# Patient Record
Sex: Male | Born: 1960 | Race: White | Hispanic: No | Smoking: Never smoker
Health system: Southern US, Community
[De-identification: ages and names within clinical notes are randomized; demographics above are authoritative.]

---

## 1997-11-26 ENCOUNTER — Ambulatory Visit (HOSPITAL_COMMUNITY): Admission: RE | Admit: 1997-11-26 | Discharge: 1997-11-26 | Payer: Self-pay | Admitting: *Deleted

## 1998-01-26 ENCOUNTER — Encounter: Admission: RE | Admit: 1998-01-26 | Discharge: 1998-04-26 | Payer: Self-pay | Admitting: Anesthesiology

## 1998-07-21 ENCOUNTER — Encounter: Admission: RE | Admit: 1998-07-21 | Discharge: 1998-07-21 | Payer: Self-pay | Admitting: Pediatrics

## 2003-05-07 ENCOUNTER — Encounter: Admission: RE | Admit: 2003-05-07 | Discharge: 2003-05-07 | Payer: Self-pay | Admitting: Family Medicine

## 2003-06-17 ENCOUNTER — Emergency Department (HOSPITAL_COMMUNITY): Admission: EM | Admit: 2003-06-17 | Discharge: 2003-06-18 | Payer: Self-pay | Admitting: Emergency Medicine

## 2004-05-29 IMAGING — CR DG CHEST 1V PORT
1 series · 1 of 1 positions shown · non-contrast
Comparison: none

CLINICAL DATA: 43 year-old male with chest pain
 PORTABLE CHEST   (6669 hours)
 No comparison.

[view not recorded]
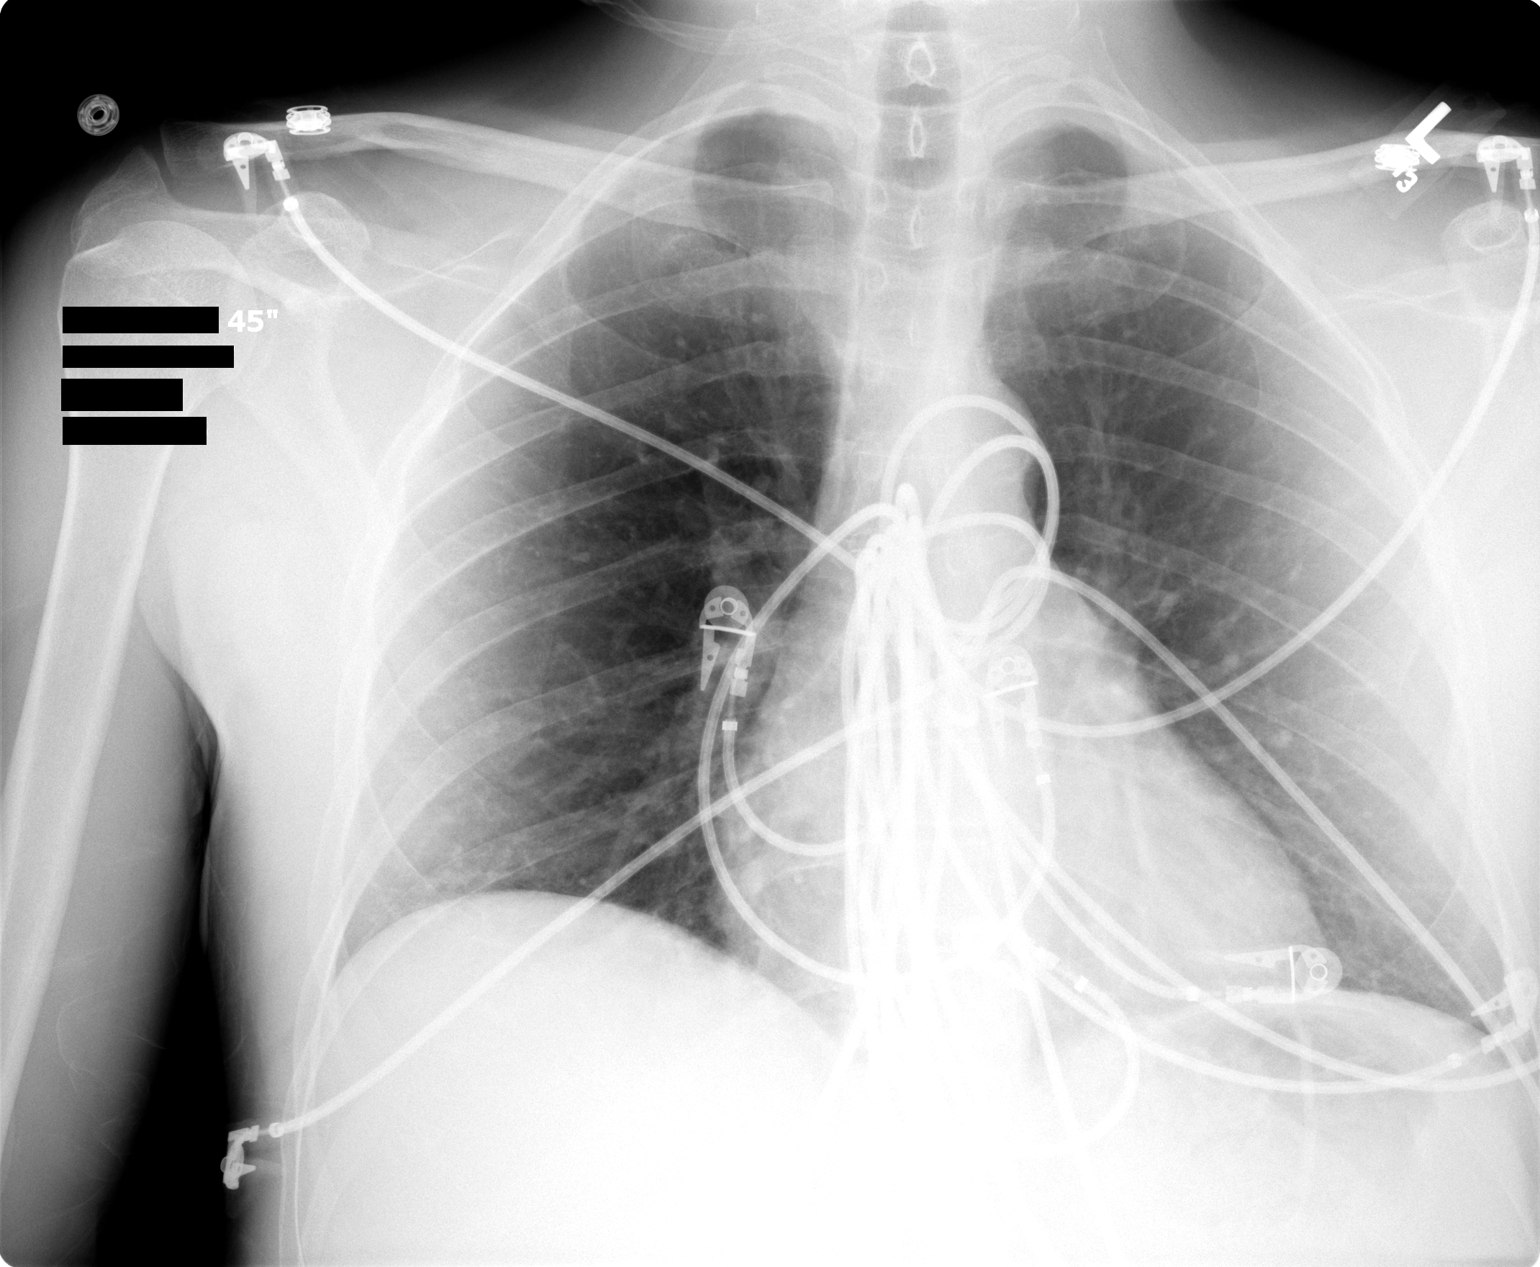

[1 of 1 positions shown; findings below may reference images not displayed]

FINDINGS: Telemetry leads overlie the chest.  Heart size is normal for technique.  No acute air space disease, edema, effusion, or pneumothorax.
 IMPRESSION
 No active chest disease.

## 2008-10-29 ENCOUNTER — Emergency Department (HOSPITAL_COMMUNITY): Admission: EM | Admit: 2008-10-29 | Discharge: 2008-10-29 | Payer: Self-pay | Admitting: Emergency Medicine

## 2008-12-28 ENCOUNTER — Encounter: Admission: RE | Admit: 2008-12-28 | Discharge: 2008-12-28 | Payer: Self-pay | Admitting: Family Medicine

## 2009-12-10 IMAGING — CT CT ABDOMEN W/ CM
3 of 5 series · 13 of 36 positions shown, 19 images · IV contrast (READICAT/WATER & [ID] OMNI [id number])
Comparison: None

CT ABDOMEN

CLINICAL DATA: Diffuse abdominal pain.  Cramping.  No history of
surgery or trauma.

CT ABDOMEN AND PELVIS WITH CONTRAST
TECHNIQUE: Multidetector CT imaging of the abdomen and pelvis was
performed using the standard protocol following bolus
administration of intravenous contrast.
Contrast: 125 ml of Vmnipaque-LLL

[Series 3: routine abdomen · axial · 0.74mm/px · z∈[-416,-91]mm · 7 of 87 slices shown, 12 images]
[im 11/87  soft-tissue]
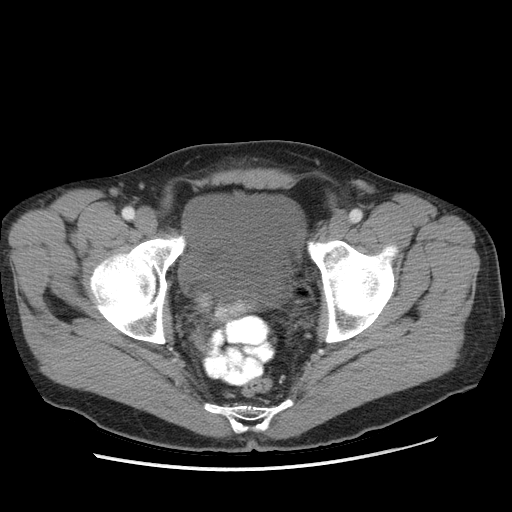
[im 11/87  bone]
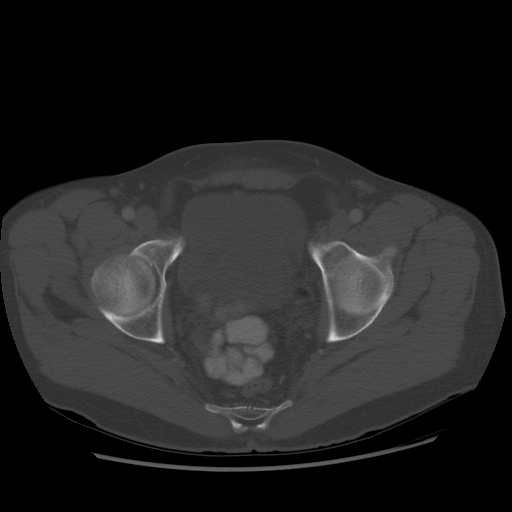
[im 22/87  soft-tissue]
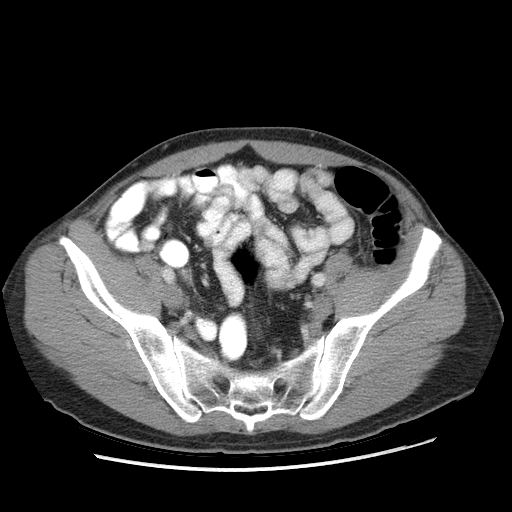
[im 33/87  soft-tissue]
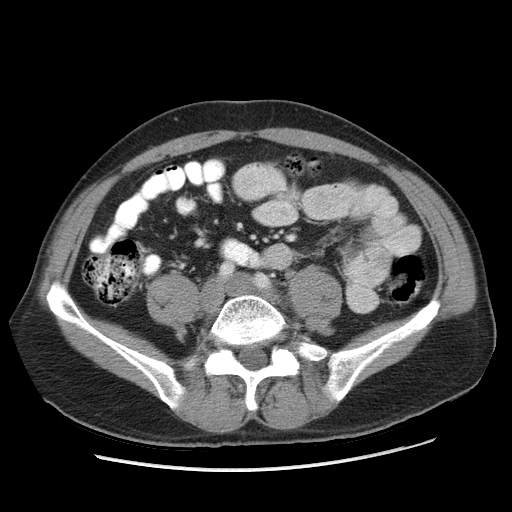
[im 44/87  soft-tissue]
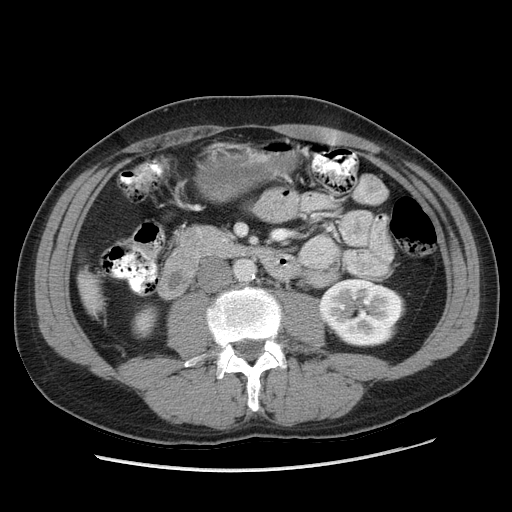
[im 44/87  lung]
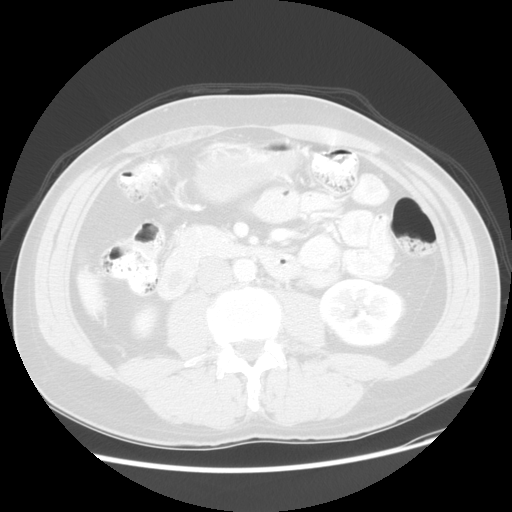
[im 54/87  soft-tissue]
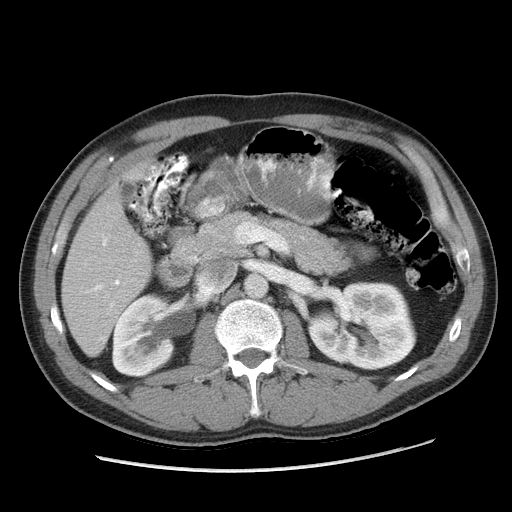
[im 54/87  lung]
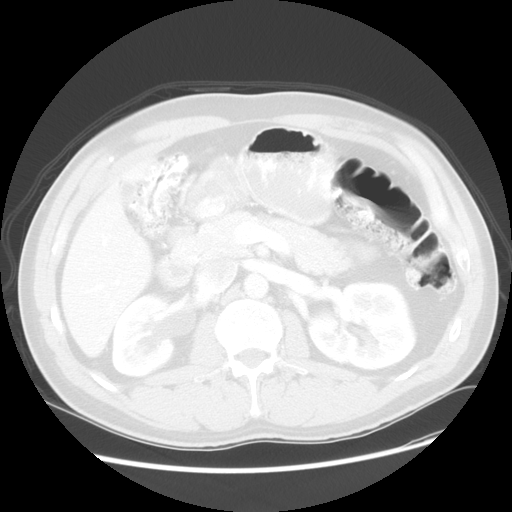
[im 65/87  soft-tissue]
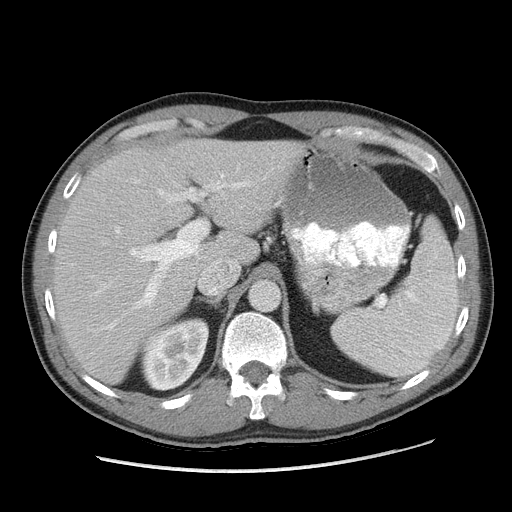
[im 65/87  lung]
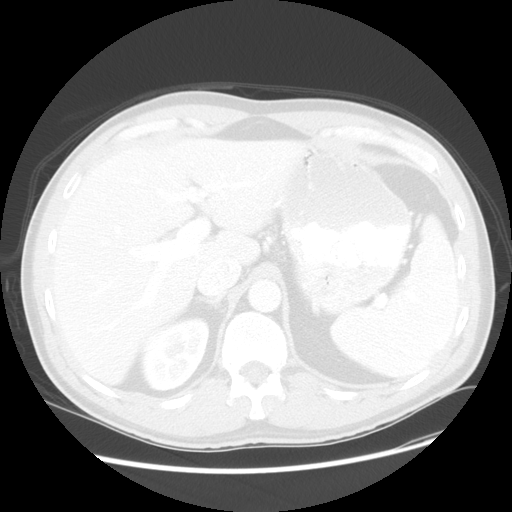
[im 76/87  soft-tissue]
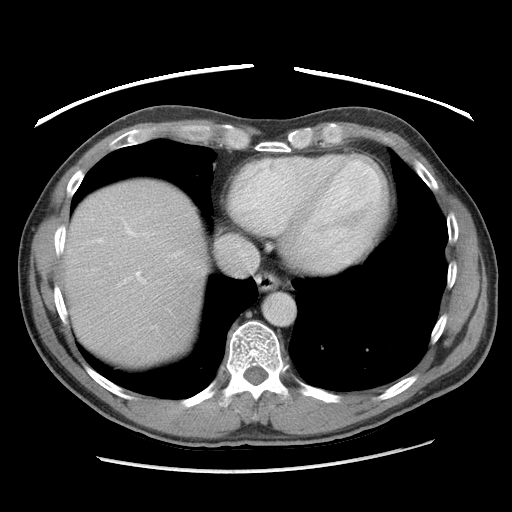
[im 76/87  lung]
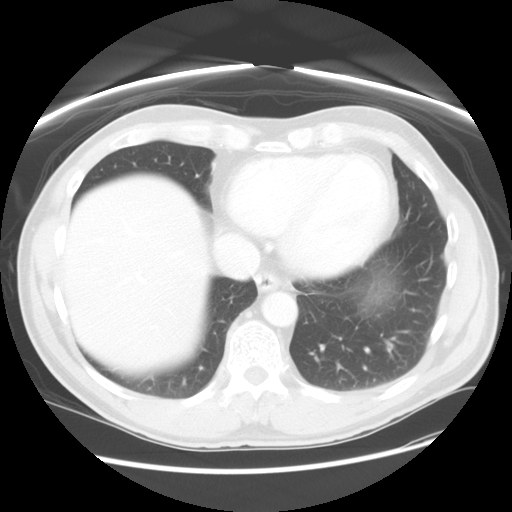

[Series 601: coronal body · coronal · 0.90mm/px · 1 of 111 slices shown, 2 images]
[im 37/111  soft-tissue]
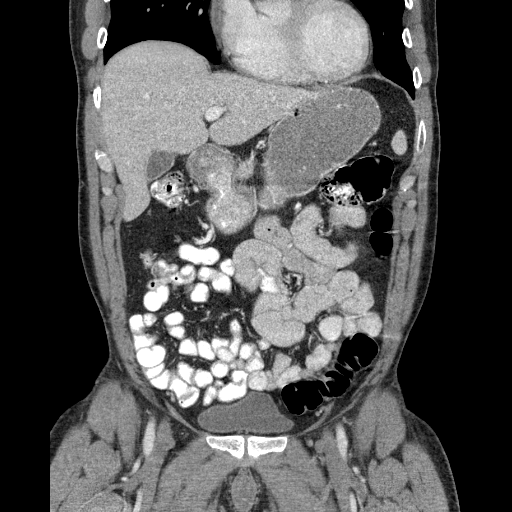
[im 37/111  bone]
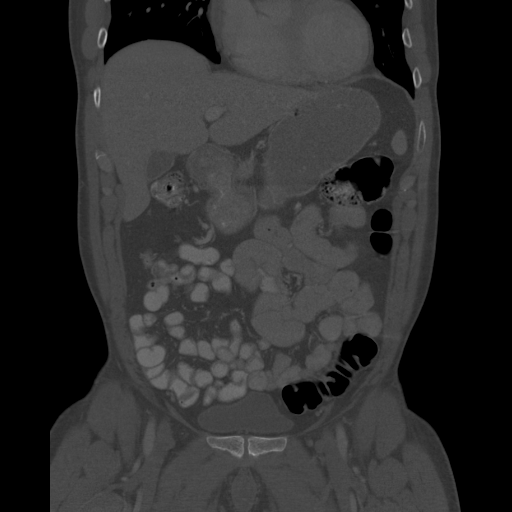

[Series 602: sagittal body · sagittal · 0.90mm/px · 5 of 153 slices shown]
[im 11/153  soft-tissue]
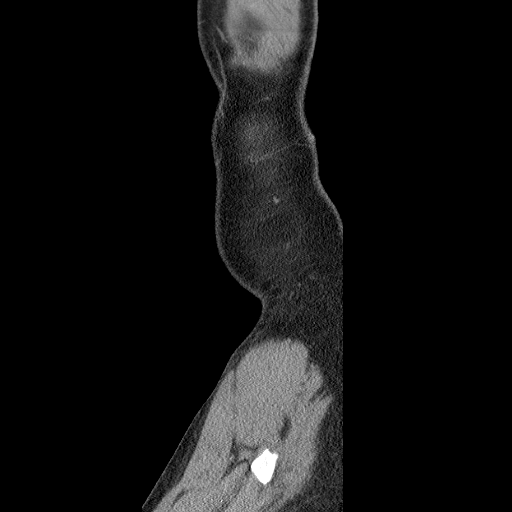
[im 31/153  soft-tissue]
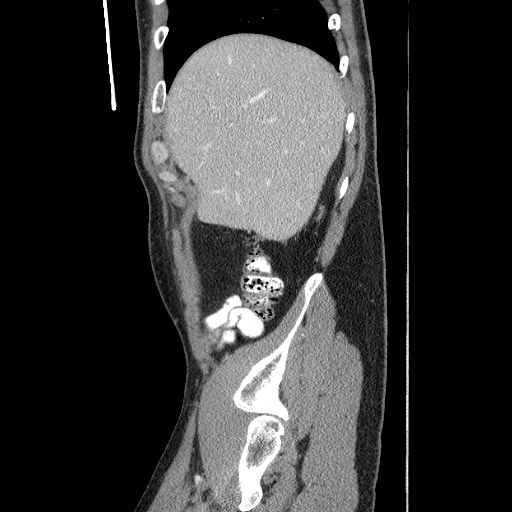
[im 51/153  soft-tissue]
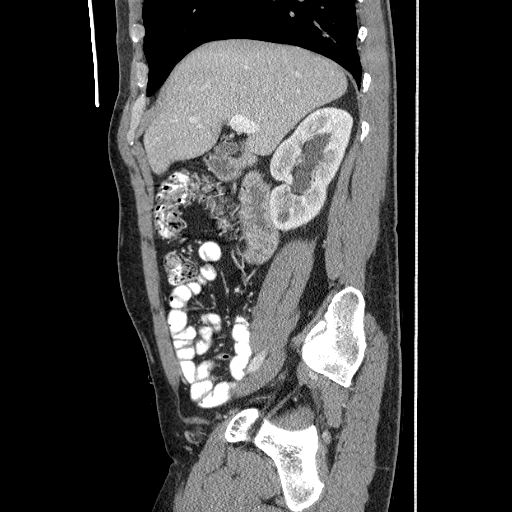
[im 71/153  soft-tissue]
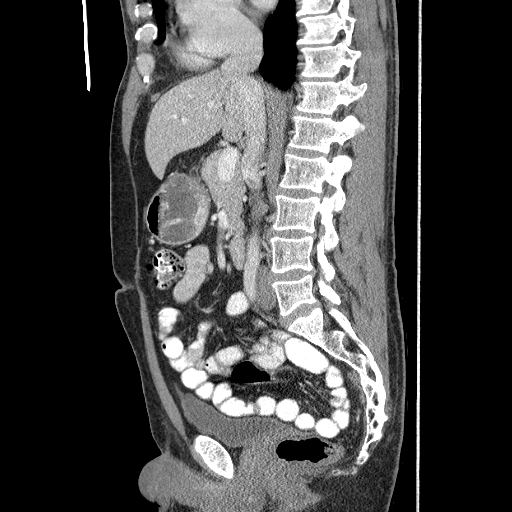
[im 82/153  soft-tissue]
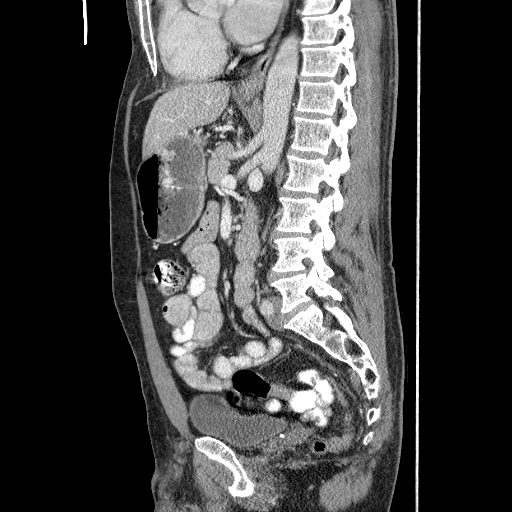

[13 of 36 positions shown; findings below may reference images not displayed]

FINDINGS: The lung bases are clear except for minimal dependent
atelectasis.  The distal esophagus is unremarkable.

The liver demonstrates no significant abnormalities.  The
gallbladder appears normal.  No biliary dilatation.  The pancreas
is unremarkable.  The spleen is normal in size.  The adrenal glands
are normal.  Left kidney is normal.  The right kidney demonstrates
moderate hydronephrosis and this is due to a 3 x 3 mm right mid
ureteral calculus located at the L4 level.

The stomach, duodenum, small bowel and colon are unremarkable.  No
mesenteric or retroperitoneal masses or adenopathy.  The aorta is
normal in caliber.  Mild atherosclerotic changes.

The bony structures are unremarkable.
IMPRESSION: 1.  3 x 3 mm right mid ureteral calculus causing moderate right-
sided hydroureteronephrosis.
2.  No other significant abdominal findings.

CT PELVIS
FINDINGS: The rectum, sigmoid colon visualized small bowel loops
are unremarkable.  The appendix is normal.  The bladder is normal.
No distal ureteral or bladder calculi.  No pelvic mass or
adenopathy.  No inguinal mass or hernia.  The bony pelvis is
intact.
IMPRESSION: Unremarkable CT pelvis.

## 2010-06-24 LAB — CBC
MCHC: 34.1 g/dL (ref 30.0–36.0)
MCV: 88 fL (ref 78.0–100.0)
Platelets: 216 10*3/uL (ref 150–400)
RDW: 13.7 % (ref 11.5–15.5)
WBC: 12.3 10*3/uL — ABNORMAL HIGH (ref 4.0–10.5)

## 2010-06-24 LAB — POCT CARDIAC MARKERS

## 2010-06-24 LAB — DIFFERENTIAL
Basophils Absolute: 0 10*3/uL (ref 0.0–0.1)
Basophils Relative: 0 % (ref 0–1)
Eosinophils Absolute: 0.1 10*3/uL (ref 0.0–0.7)
Lymphs Abs: 0.4 10*3/uL — ABNORMAL LOW (ref 0.7–4.0)
Neutrophils Relative %: 90 % — ABNORMAL HIGH (ref 43–77)

## 2010-06-24 LAB — BASIC METABOLIC PANEL
BUN: 13 mg/dL (ref 6–23)
Chloride: 106 mEq/L (ref 96–112)
Creatinine, Ser: 0.72 mg/dL (ref 0.4–1.5)
Glucose, Bld: 103 mg/dL — ABNORMAL HIGH (ref 70–99)

## 2010-06-24 LAB — D-DIMER, QUANTITATIVE: D-Dimer, Quant: <0.22 ug/mL-FEU (ref 0.00–0.48)

## 2018-05-16 ENCOUNTER — Other Ambulatory Visit: Payer: Self-pay | Admitting: Family Medicine

## 2018-05-16 DIAGNOSIS — Z8739 Personal history of other diseases of the musculoskeletal system and connective tissue: Secondary | ICD-10-CM

## 2018-05-16 DIAGNOSIS — Z8781 Personal history of (healed) traumatic fracture: Secondary | ICD-10-CM

## 2020-05-05 ENCOUNTER — Other Ambulatory Visit: Payer: Self-pay

## 2020-05-05 ENCOUNTER — Encounter: Payer: Self-pay | Admitting: Cardiology

## 2020-05-05 ENCOUNTER — Ambulatory Visit: Payer: BC Managed Care – PPO | Admitting: Cardiology

## 2020-05-05 VITALS — BP 124/70 | HR 58 | Ht 66.0 in | Wt 162.6 lb

## 2020-05-05 DIAGNOSIS — Z8249 Family history of ischemic heart disease and other diseases of the circulatory system: Secondary | ICD-10-CM

## 2020-05-05 NOTE — Patient Instructions (Signed)
Medication Instructions:  No changes  *If you need a refill on your cardiac medications before your next appointment, please call your pharmacy*   Lab Work: Not needed    Testing/Procedures:  CT coronary calcium score. This test is done at [address]. This is $99 out of pocket.   Coronary CalciumScan A coronary calcium scan is an imaging test used to look for deposits of calcium and other fatty materials (plaques) in the inner lining of the blood vessels of the heart (coronary arteries). These deposits of calcium and plaques can partly clog and narrow the coronary arteries without producing any symptoms or warning signs. This puts a person at risk for a heart attack. This test can detect these deposits before symptoms develop. Tell a health care provider about:  Any allergies you have.  All medicines you are taking, including vitamins, herbs, eye drops, creams, and over-the-counter medicines.  Any problems you or family members have had with anesthetic medicines.  Any blood disorders you have.  Any surgeries you have had.  Any medical conditions you have.  Whether you are pregnant or may be pregnant. What are the risks? Generally, this is a safe procedure. However, problems may occur, including:  Harm to a pregnant woman and her unborn baby. This test involves the use of radiation. Radiation exposure can be dangerous to a pregnant woman and her unborn baby. If you are pregnant, you generally should not have this procedure done.  Slight increase in the risk of cancer. This is because of the radiation involved in the test. What happens before the procedure? No preparation is needed for this procedure. What happens during the procedure?  You will undress and remove any jewelry around your neck or chest.  You will put on a hospital gown.  Sticky electrodes will be placed on your chest. The electrodes will be connected to an electrocardiogram (ECG)  machine to record a tracing of the electrical activity of your heart.  A CT scanner will take pictures of your heart. During this time, you will be asked to lie still and hold your breath for 2-3 seconds while a picture of your heart is being taken. The procedure may vary among health care providers and hospitals. What happens after the procedure?  You can get dressed.  You can return to your normal activities.  It is up to you to get the results of your test. Ask your health care provider, or the department that is doing the test, when your results will be ready. Summary  A coronary calcium scan is an imaging test used to look for deposits of calcium and other fatty materials (plaques) in the inner lining of the blood vessels of the heart (coronary arteries).  Generally, this is a safe procedure. Tell your health care provider if you are pregnant or may be pregnant.  No preparation is needed for this procedure.  A CT scanner will take pictures of your heart.  You can return to your normal activities after the scan is done. This information is not intended to replace advice given to you by your health care provider. Make sure you discuss any questions you have with your health care provider. Document Released: 09/01/2007 Document Revised: 01/23/2016 Document Reviewed: 01/23/2016 Elsevier Interactive Patient Education  2017 [employer].   Follow-Up: At [facility], you and your health needs are our priority.  As part of our continuing mission to provide you with exceptional heart care, we have created  designated Provider Care Teams.  These Care Teams include your primary Cardiologist (physician) and Advanced Practice Providers (APPs -  Physician Assistants and Nurse Practitioners) who all work together to provide you with the care you need, when you need it.     Your next appointment:   1 month(s)  The format for your next appointment:   In Person  Provider:   Bryan Lemma, MD   Other Instructions   DR Herbie Baltimore RECOMMENDS THAT YOU  PURCHASES  " Kardia" By [employer]  [employer]. FROM THE  GOOGLE[employer]  APP PLAY STORE.   THE APP IS FREE , BUT THE  EQUIPMENT HAS A COST. IT IS A WAY FOR YOU TO OBTAIN A RECORDING OF YOUR HEART RATE . IT WILL BE A SHORT RHYTHM  STRIP THAT YOU CAN SHOW TO MEDICAL STAFF.  ALSO , YOU MAY WANT RESEARCH [employer] - EMAY - EKG- MONITOR YOU ARE ABLE TO KEEP RECORDINGS ON YOUR SMARTPHONE , INSTEAD OF PURCHASING A SEPARATE EQUIPMENT   Dr Herbie Baltimore  recommends portable  ECG monitor  to capture heart rate reading. There is a monitor that can be purchased on [employer]. The  monitor name is "EMAY"  at last checked it cost $79.  Our understanding there no more cost of that purchasing the machine. You will need a computer to be able to upload your recording and to be printed. It also come with a  USB to charge the monitor.

## 2020-05-05 NOTE — Progress Notes (Signed)
Primary Care Provider: Daisy Floro, MD Cardiologist: No primary care provider on file. Electrophysiologist: None  Clinic Note: Chief Complaint  Patient presents with  . New Patient (Initial Visit)    Observation cardiac evaluation given his family history of CAD.  He himself is asymptomatic.   ===================================  ASSESSMENT/PLAN   Problem List Items Addressed This Visit    Family history of atrial fibrillation - Primary (Chronic)    He has not had any palpitations or irregular heartbeats besides a few skipped beats here today.  Unlikely he would have A. fib, and not know it.  I explained the difference in atrial fibrillation, flutter and SVT.  We talked about pathophysiology and treatment  Recommended that he purchase either the  Kardia" By [employer]. (Google or [employer]) or [employer] monitor purchased through [employer]      Relevant Orders   CT CARDIAC SCORING (SELF PAY ONLY)   EKG 12-Lead   Family history of premature coronary artery disease (Chronic)    He was less concerned about his sisters cardiac score along with A. fib diagnosis is the fact that his father had both as well.   He wanted to get a baseline assessment. I explained the pathophysiology of atherosclerotic coronary artery disease.  Plan: Coronary calcium score      Relevant Orders   CT CARDIAC SCORING (SELF PAY ONLY)   EKG 12-Lead      ===================================  HPI:    Chris Wilson is a 60 y.o. male with a PMH mild depression and recent COVID-19 infection in January 2022 (treated with over-the-counter remedies) is being seen today for CARDIAC EVALUATION given EXTENSIVE FAMILY HISTORY OF CARDIAC DISEASE at the request of Daisy Floro, MD.  Becky Augusta was seen by Dr. Tenny Craw on February 7.  Apparently discussed that he needed COVID-19 but before that.  He did mention is concerned about his sister recently been diagnosed with A. fib and his father having had CAD,  CABG and A. fib.  He was concerned about his own risk.  Notes a very healthy diet with exercise.  Recent Hospitalizations: None  Reviewed  CV studies:    The following studies were reviewed today: (if available, images/films reviewed: From Epic Chart or Care Everywhere) . N/A  Interval History:   [name] is here today for cardiology evaluation.  He is totally asymptomatic cardiac standpoint.  He he noted having some intermittent bouts of sharp chest wall pain after long bouts of coughing when he had COVID.  In the edition exertional, therefore is not present now.  He otherwise has no exertional dyspnea.  No sense of arrhythmias.  Discussed anxiety.  Feels her heart skip every now and then.  He may get little bit dizzy if he gets up quickly.  CV Review of Symptoms (Summary): positive for - Chest wall pain, rare skipped beats, occasional dizziness negative for - dyspnea on exertion, edema, orthopnea, paroxysmal nocturnal dyspnea, rapid heart rate, shortness of breath or Lightheadedness dizziness, syncope/near syncope or TIA/amaurosis fugax, claudication Not as active as he once was, but still pretty healthy  The patient does not have symptoms concerning for COVID-19 infection (fever, chills, cough, or new shortness of breath).   REVIEWED OF SYSTEMS   Review of Systems  Constitutional: Negative for malaise/fatigue and weight loss.  HENT: Negative for congestion and sinus pain.   Respiratory: Negative for cough and shortness of breath.        No longer having  cough shortness of breath following his COVID-19  Cardiovascular: Negative for leg swelling.  Gastrointestinal: Negative for blood in stool and melena.  Genitourinary: Negative for frequency and hematuria.  Musculoskeletal: Positive for joint pain (His ankle still bothers him.).  Neurological: Negative for dizziness, focal weakness and headaches.  Psychiatric/Behavioral: Negative for memory loss. The patient is  nervous/anxious. The patient does not have insomnia.    I have reviewed and (if needed) personally updated the patient's problem list, medications, allergies, past medical and surgical history, social and family history.   PAST MEDICAL HISTORY   History reviewed. No pertinent past medical history.  PAST SURGICAL HISTORY   History reviewed. No pertinent surgical history.   There is no immunization history on file for this patient.  MEDICATIONS/ALLERGIES   Current Meds  Medication Sig  . albuterol (VENTOLIN HFA) 108 (90 Base) MCG/ACT inhaler 2 puff as needed  . ASPIRIN 81 PO Take 81 mg by mouth daily.  . clonazePAM (KLONOPIN) 2 MG tablet Take 2 mg by mouth at bedtime.  . cyanocobalamin 1000 MCG Marland Kitchentablet Take 1,000 mcg by mouth daily.  . desonide (DESOWEN) 0.05 % lotion Apply topically as needed.  Marland Kitchen escitalopram (LEXAPRO) 20 MG tablet Take 20 mg by mouth in the morning.  . mirtazapine (REMERON) 45 MG tablet Take 45 mg by mouth at bedtime.  Marland Kitchen VIIBRYD 40 MG TABS Take 40 mg by mouth in the morning.    Allergies  Allergen Reactions  . Seroquel [Quetiapine]     Other reaction(s): worsening symptoms    SOCIAL HISTORY/FAMILY HISTORY   Reviewed in Epic:  Pertinent findings:  Social History   Tobacco Use  . Smoking status: Never Smoker  . Smokeless tobacco: Never Used  Substance Use Topics  . Alcohol use: Not Currently  . Drug use: Not Currently    Types: Marijuana   Social History   Social History Narrative   Currently going through divorce.  Has 1 child.   Parents are negatives of [place].   He is former Actuary. Cabin crew      Does not drink alcohol caffeine or sodas.  No recreational drug use.  Non-smoker.   Is a very healthy diet of fruits and vegetables with no dairy.  He actually has not been eating as much since the onset of the divorce.   Drinks at least 1 gallon of water or mixed water drink daily.   Walks routinely enjoys doing yard work Catering manager.      Needs to be very  active exercising until 1244 when he fell on a ladder and injured his ankle.  Not able to run anymore.    OBJCTIVE -PE, EKG, labs   Wt Readings from Last 3 Encounters:  05/05/20 162 lb 9.6 oz (73.8 kg)    Physical Exam: BP 124/70 (BP Location: Left Arm, Patient Position: Sitting)   Pulse (!) 58   Ht  (1.676 m)   Wt 162 lb 9.6 oz (73.8 kg)   SpO2 96%   BMI 26.24 kg/m  Physical Exam Vitals reviewed.  Constitutional:      General: He is not in acute distress.    Appearance: Normal appearance. He is normal weight. He is not ill-appearing.  HENT:     Head: Normocephalic and atraumatic.  Neck:     Vascular: No carotid bruit, hepatojugular reflux or JVD.  Cardiovascular:     Rate and Rhythm: Normal rate and regular rhythm.     Pulses: Normal pulses.  Heart sounds: Normal heart sounds. No murmur heard. No friction rub. No gallop.   Pulmonary:     Effort: Pulmonary effort is normal. No respiratory distress.     Breath sounds: Normal breath sounds.  Chest:     Chest wall: No tenderness.  Abdominal:     General: Abdomen is flat. Bowel sounds are normal. There is no distension.     Palpations: There is no mass.     Tenderness: There is no abdominal tenderness.  Musculoskeletal:        General: No swelling. Normal range of motion.     Cervical back: Normal range of motion and neck supple. No rigidity.  Skin:    General: Skin is warm and dry.  Neurological:     General: No focal deficit present.     Mental Status: He is alert and oriented to person, place, and time.     Cranial Nerves: No cranial nerve deficit.     Motor: No weakness.     Gait: Gait normal.  Psychiatric:        Mood and Affect: Mood normal.        Behavior: Behavior normal.        Thought Content: Thought content normal.        Judgment: Judgment normal.     Adult ECG Report  Rate: 58;  Rhythm: sinus bradycardia and Normal axis, intervals and durations.;   Narrative Interpretation:  Normal  Recent Labs:   -> Tested Covid positive Labs ordered by PCP, but not available. No results found for: CHOL, HDL, LDLCALC, LDLDIRECT, TRIG, CHOLHDL Lab Results  Component Value Date   CREATININE 0.72 10/29/2008   BUN 13 10/29/2008   NA 138 10/29/2008   K 4.2 10/29/2008   CL 106 10/29/2008   CO2 28 10/29/2008   CBC Latest Ref Rng & Units 10/29/2008  WBC 4.0 - 10.5 K/uL 12.3(H)  Hemoglobin 13.0 - 17.0 g/dL 27.5  Hematocrit 17.0 - 52.0 % 39.6  Platelets 150 - 400 K/uL 216    No results found for: TSH  ==================================================  COVID-19 Education: The signs and symptoms of COVID-19 were discussed with the patient and how to seek care for testing (follow up with PCP or arrange E-visit).   The importance of social distancing and COVID-19 vaccination was discussed today. The patient is practicing social distancing & Masking.   I spent a total of with the patient spent in direct patient consultation.  He had lots of questions about coronary artery disease, valvular disease and atrial fibrillation.  He talked about being concerned with his sister's recent diagnosis and evaluation for coronary calcium score.  He has done lots of research and had lots of questions. Additional time spent with chart review  / charting (studies, outside notes, etc): 10 min Total Time: 64 min  Current medicines are reviewed at length with the patient today.  (+/- concerns) n/a  This visit occurred during the SARS-CoV-2 public health emergency.  Safety protocols were in place, including screening questions prior to the visit, additional usage of staff PPE, and extensive cleaning of exam room while observing appropriate contact time as indicated for disinfecting solutions.  Notice: This dictation was prepared with Dragon dictation along with smaller phrase technology. Any transcriptional errors that result from this process are unintentional and may not be corrected  upon review.  Patient Instructions / Medication Changes & Studies & Tests Ordered   Patient Instructions  Medication Instructions:  No changes  *If  you need a refill on your cardiac medications before your next appointment, please call your pharmacy*   Lab Work: Not needed    Testing/Procedures:  CT coronary calcium score. This test is done [address]. This is $99 out of pocket.   Coronary CalciumScan A coronary calcium scan is an imaging test used to look for deposits of calcium and other fatty materials (plaques) in the inner lining of the blood vessels of the heart (coronary arteries). These deposits of calcium and plaques can partly clog and narrow the coronary arteries without producing any symptoms or warning signs. This puts a person at risk for a heart attack. This test can detect these deposits before symptoms develop. Tell a health care provider about:  Any allergies you have.  All medicines you are taking, including vitamins, herbs, eye drops, creams, and over-the-counter medicines.  Any problems you or family members have had with anesthetic medicines.  Any blood disorders you have.  Any surgeries you have had.  Any medical conditions you have.  Whether you are pregnant or may be pregnant. What are the risks? Generally, this is a safe procedure. However, problems may occur, including:  Harm to a pregnant woman and her unborn baby. This test involves the use of radiation. Radiation exposure can be dangerous to a pregnant woman and her unborn baby. If you are pregnant, you generally should not have this procedure done.  Slight increase in the risk of cancer. This is because of the radiation involved in the test. What happens before the procedure? No preparation is needed for this procedure. What happens during the procedure?  You will undress and remove any jewelry around your neck or chest.  You will put on a hospital gown.  Sticky  electrodes will be placed on your chest. The electrodes will be connected to an electrocardiogram (ECG) machine to record a tracing of the electrical activity of your heart.  A CT scanner will take pictures of your heart. During this time, you will be asked to lie still and hold your breath for 2-3 seconds while a picture of your heart is being taken. The procedure may vary among health care providers and hospitals. What happens after the procedure?  You can get dressed.  You can return to your normal activities.  It is up to you to get the results of your test. Ask your health care provider, or the department that is doing the test, when your results will be ready. Summary  A coronary calcium scan is an imaging test used to look for deposits of calcium and other fatty materials (plaques) in the inner lining of the blood vessels of the heart (coronary arteries).  Generally, this is a safe procedure. Tell your health care provider if you are pregnant or may be pregnant.  No preparation is needed for this procedure.  A CT scanner will take pictures of your heart.  You can return to your normal activities after the scan is done. This information is not intended to replace advice given to you by your health care provider. Make sure you discuss any questions you have with your health care provider. Document Released: 09/01/2007 Document Revised: 01/23/2016 Document Reviewed: 01/23/2016 Elsevier Interactive Patient Education  2017 [employer]   Follow-Up: At [facility], you and your health needs are our priority.  As part of our continuing mission to provide you with exceptional heart care, we have created designated Provider Care Teams.  These Care  Teams include your primary Cardiologist (physician) and Advanced Practice Providers (APPs -  Physician Assistants and Nurse Practitioners) who all work together to provide you with the care you need, when you need it.     Your next  appointment:   1 month(s)  The format for your next appointment:   In Person  Provider:   Bryan Lemma, MD   Other Instructions   DR Herbie Baltimore RECOMMENDS THAT YOU  PURCHASES  " Kardia" By [employer]  [employer]. FROM THE  [employer]OOGLE[employer]  APP PLAY STORE.   THE APP IS FREE , BUT THE  EQUIPMENT HAS A COST. IT IS A WAY FOR YOU TO OBTAIN A RECORDING OF YOUR HEART RATE . IT WILL BE A SHORT RHYTHM  STRIP THAT YOU CAN SHOW TO MEDICAL STAFF.  ALSO , YOU MAY WANT RESEARCH [employer] - EMAY - EKG- MONITOR YOU ARE ABLE TO KEEP RECORDINGS ON YOUR SMARTPHONE , INSTEAD OF PURCHASING A SEPARATE EQUIPMENT   Dr Herbie Baltimore  recommends portable  ECG monitor  to capture heart rate reading. There is a monitor that can be purchased on [employer]. The  monitor name is "EMAY"  at last checked it cost $79.  Our understanding there no more cost of that purchasing the machine. You will need a computer to be able to upload your recording and to be printed. It also come with a  USB to charge the monitor.   Studies Ordered:   Orders Placed This Encounter  Procedures  . CT CARDIAC SCORING (SELF PAY ONLY)  . EKG 12-Lead     Bryan Lemma, M.D., M.S. Interventional Cardiologist   Pager # [phone] Phone # [phone] [address] [place], [place] [address]   Thank you for choosing [facility] at [facility]!!

## 2020-05-07 ENCOUNTER — Encounter: Payer: Self-pay | Admitting: Cardiology

## 2020-05-07 NOTE — Assessment & Plan Note (Signed)
He was less concerned about his sisters cardiac score along with A. fib diagnosis is the fact that his father had both as well.   He wanted to get a baseline assessment. I explained the pathophysiology of atherosclerotic coronary artery disease.  Plan: Coronary calcium score

## 2020-05-07 NOTE — Assessment & Plan Note (Addendum)
He has not had any palpitations or irregular heartbeats besides a few skipped beats here today.  Unlikely he would have A. fib, and not know it.  I explained the difference in atrial fibrillation, flutter and SVT.  We talked about pathophysiology and treatment  Recommended that he purchase either the  Kardia" By [employer]. (Google or [employer]) or [employer] monitor purchased through [employer]

## 2020-05-27 ENCOUNTER — Other Ambulatory Visit: Payer: BC Managed Care – PPO

## 2020-05-31 ENCOUNTER — Other Ambulatory Visit: Payer: Self-pay | Admitting: Family Medicine

## 2020-05-31 DIAGNOSIS — T07XXXA Unspecified multiple injuries, initial encounter: Secondary | ICD-10-CM

## 2020-05-31 DIAGNOSIS — Z8262 Family history of osteoporosis: Secondary | ICD-10-CM

## 2020-07-21 ENCOUNTER — Ambulatory Visit: Payer: BC Managed Care – PPO | Admitting: Cardiology

## 2020-08-18 ENCOUNTER — Other Ambulatory Visit: Payer: Self-pay

## 2020-09-22 ENCOUNTER — Inpatient Hospital Stay: Admission: RE | Admit: 2020-09-22 | Payer: Self-pay | Source: Ambulatory Visit

## 2021-09-16 DEATH — deceased
